# Patient Record
Sex: Male | Born: 1980 | Race: Black or African American | Hispanic: No | Marital: Single | Smoking: Never smoker
Health system: Southern US, Community
[De-identification: ages and names within clinical notes are randomized; demographics above are authoritative.]

## PROBLEM LIST (undated history)

## (undated) DIAGNOSIS — J45909 Unspecified asthma, uncomplicated: Secondary | ICD-10-CM

---

## 1999-06-04 ENCOUNTER — Emergency Department (HOSPITAL_COMMUNITY): Admission: EM | Admit: 1999-06-04 | Discharge: 1999-06-04 | Payer: Self-pay | Admitting: Emergency Medicine

## 2002-06-13 ENCOUNTER — Emergency Department (HOSPITAL_COMMUNITY): Admission: EM | Admit: 2002-06-13 | Discharge: 2002-06-13 | Payer: Self-pay | Admitting: Emergency Medicine

## 2003-10-01 ENCOUNTER — Encounter: Admission: RE | Admit: 2003-10-01 | Discharge: 2003-10-01 | Payer: Self-pay | Admitting: Internal Medicine

## 2004-04-15 ENCOUNTER — Encounter: Admission: RE | Admit: 2004-04-15 | Discharge: 2004-04-15 | Payer: Self-pay | Admitting: Internal Medicine

## 2004-08-12 ENCOUNTER — Encounter: Admission: RE | Admit: 2004-08-12 | Discharge: 2004-08-12 | Payer: Self-pay | Admitting: Internal Medicine

## 2004-08-12 ENCOUNTER — Ambulatory Visit (HOSPITAL_COMMUNITY): Admission: RE | Admit: 2004-08-12 | Discharge: 2004-08-12 | Payer: Self-pay | Admitting: Internal Medicine

## 2004-09-19 ENCOUNTER — Emergency Department (HOSPITAL_COMMUNITY): Admission: EM | Admit: 2004-09-19 | Discharge: 2004-09-20 | Payer: Self-pay | Admitting: Emergency Medicine

## 2006-02-24 IMAGING — US US SCROTUM
1 series · 14 of 25 positions shown · non-contrast
Comparison: none

CLINICAL DATA: Left scrotal pain. 
 SCROTAL ULTRASOUND AND ARTERIAL VENOUS DOPPLER STUDY ([DATE] HOURS)

[Series 1: unknown · 0.09mm/px · 14 of 48 slices shown]
[im 1/48]
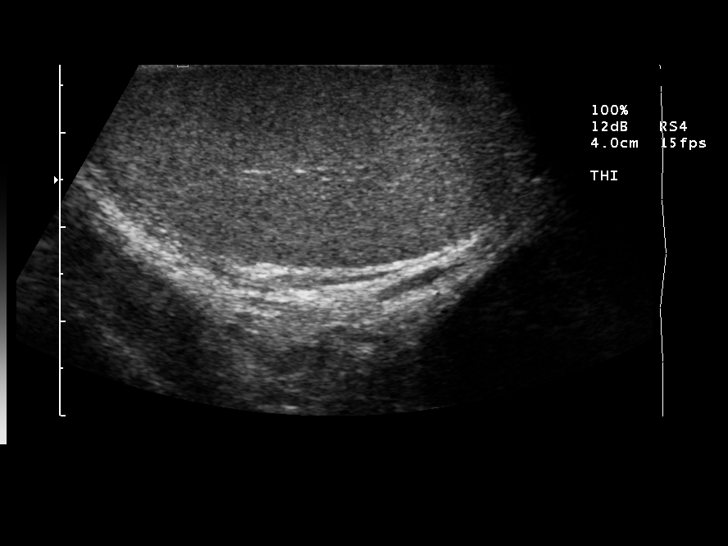
[im 4/48]
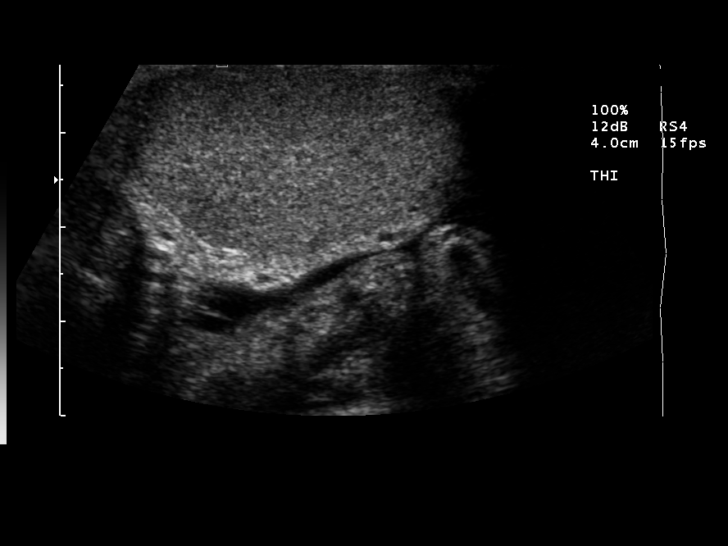
[im 8/48]
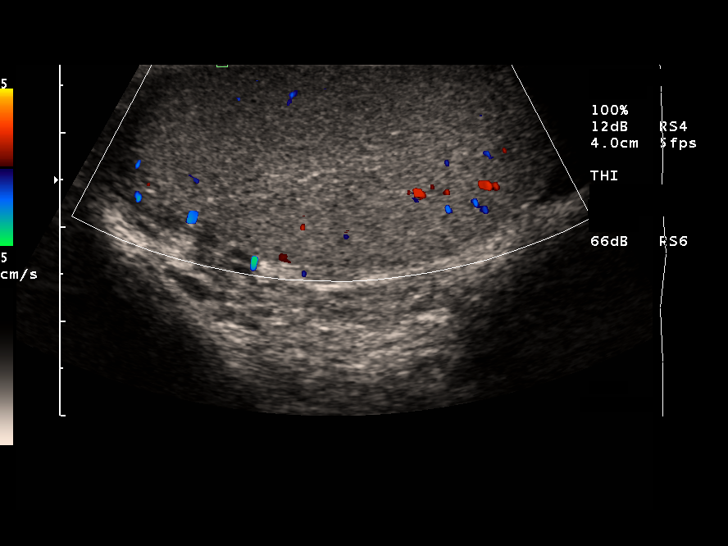
[im 12/48]
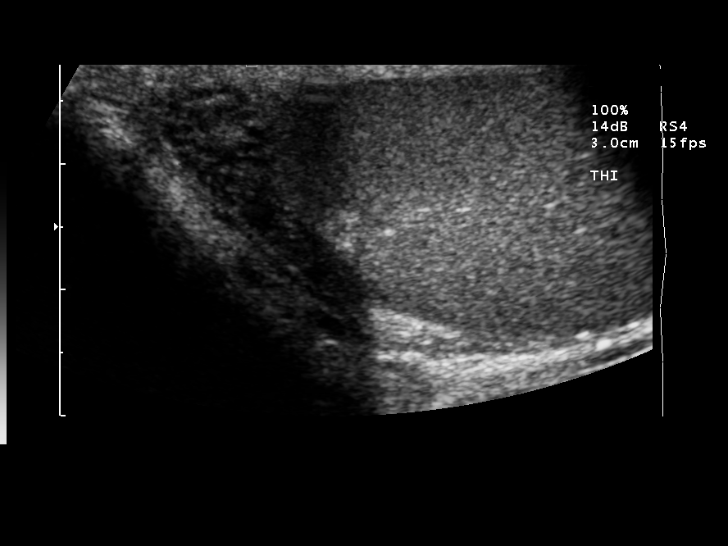
[im 16/48]
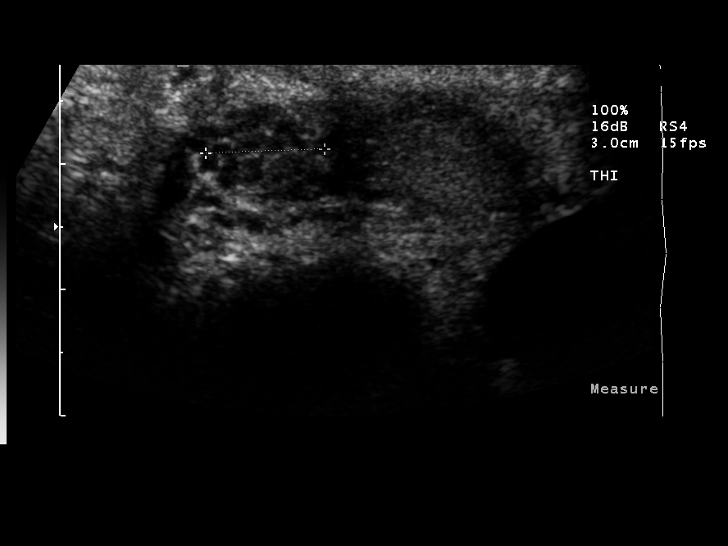
[im 18/48]
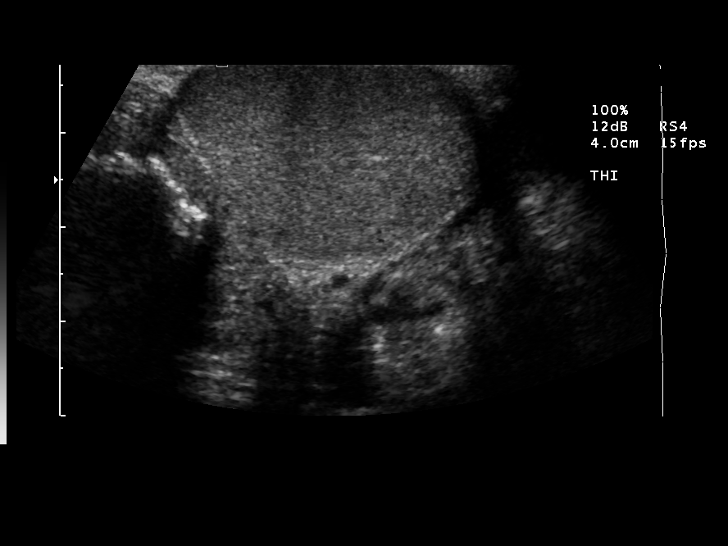
[im 22/48]
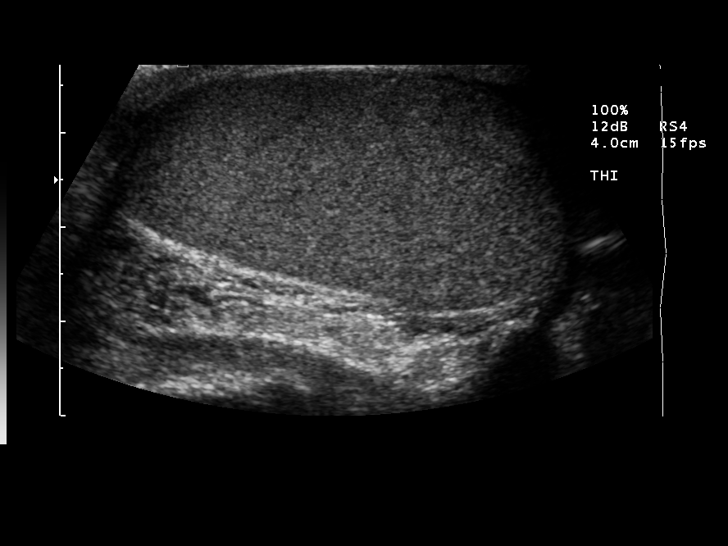
[im 26/48]
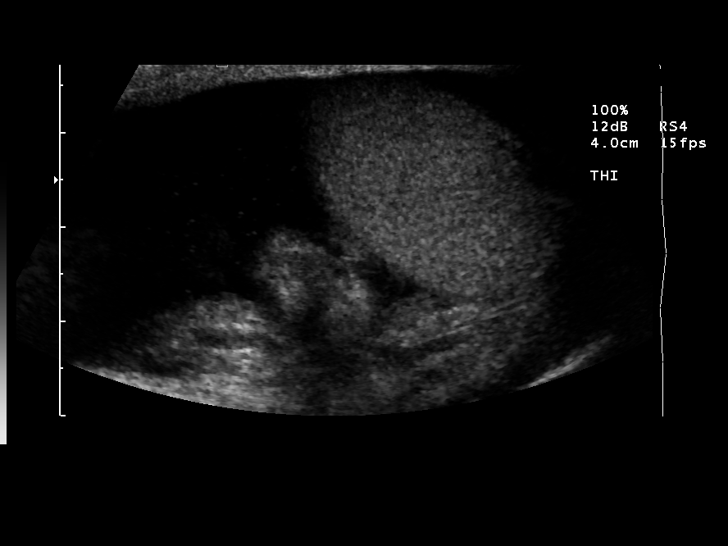
[im 30/48]
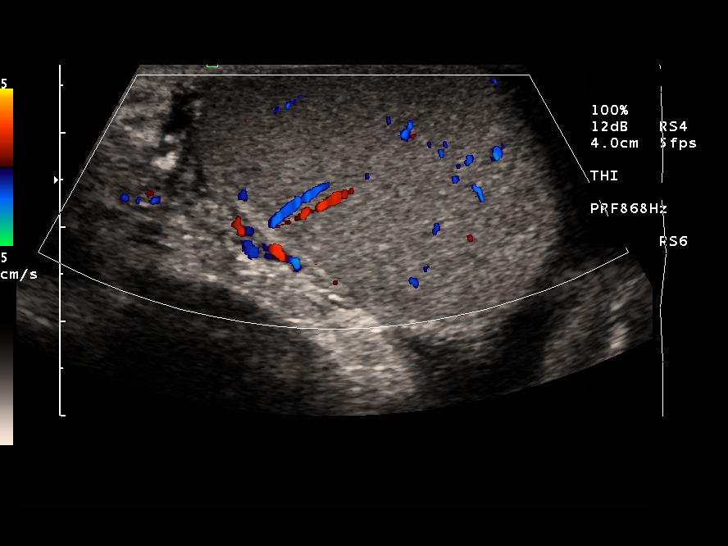
[im 32/48]
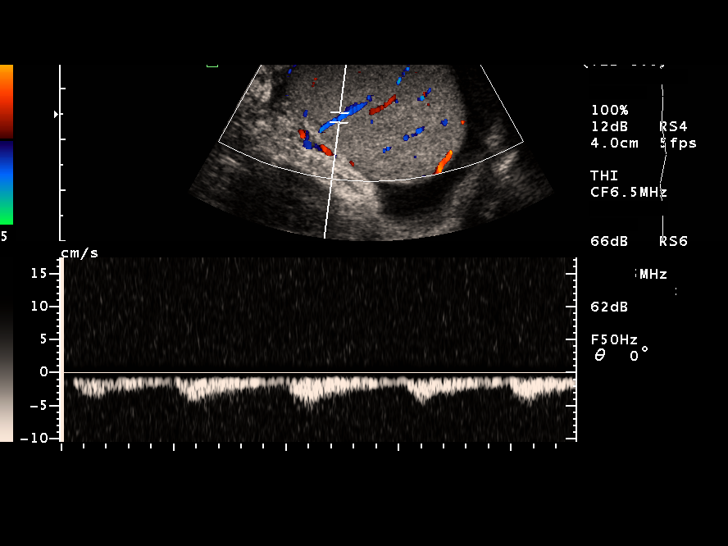
[im 36/48]
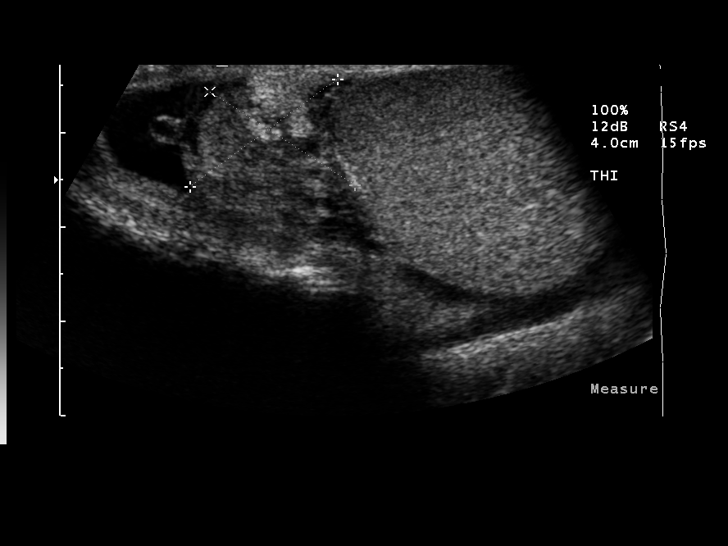
[im 40/48]
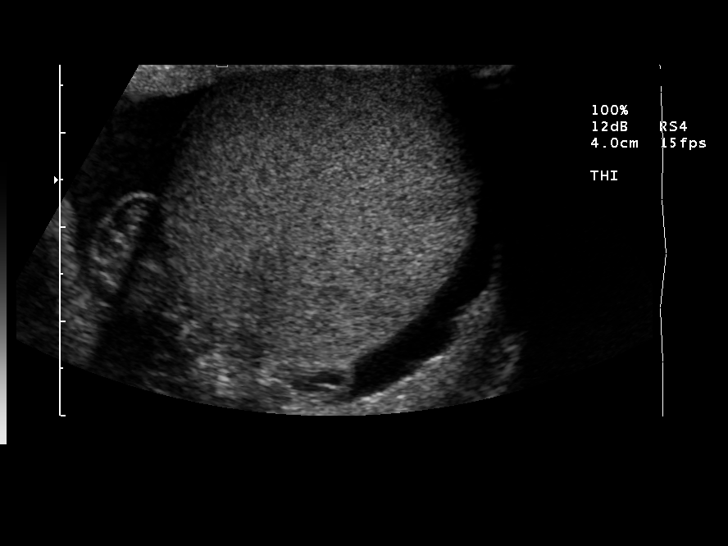
[im 44/48]
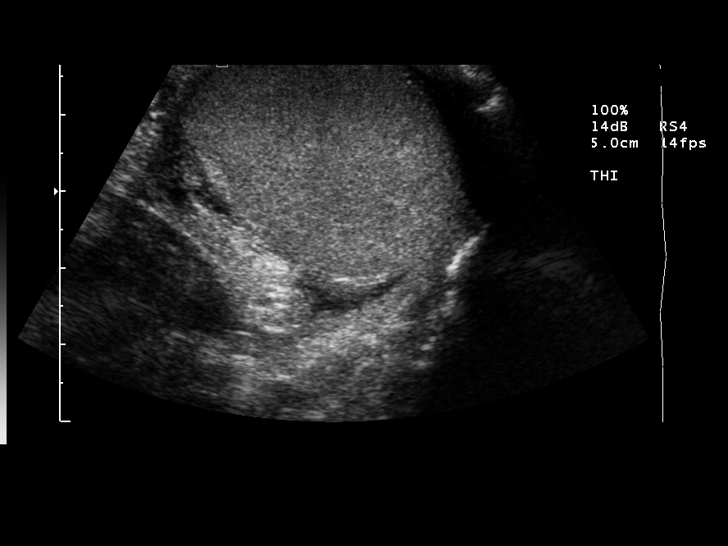
[im 48/48]
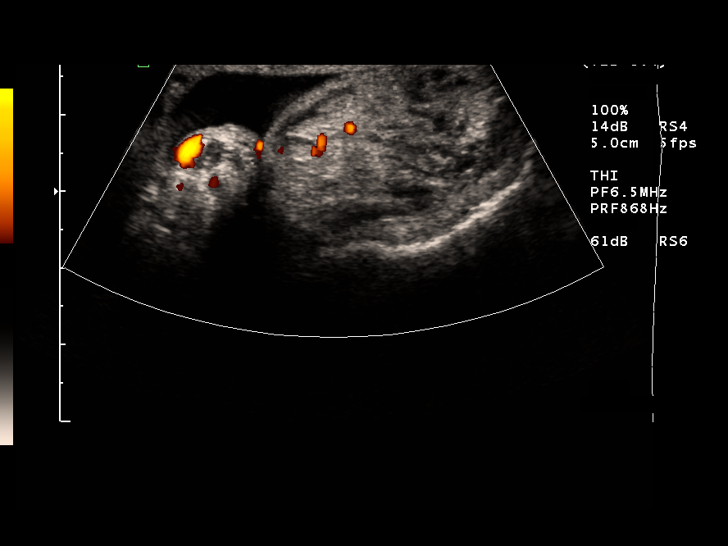

[14 of 25 positions shown; findings below may reference images not displayed]

FINDINGS: The testicles are within normal limits bilaterally.  Arterial flow is documented in both testicles excluding torsion. 
 The right epididymis is within normal limits. There is no right hydrocele. 
 The left epididymis is enlarged.  Flow is documented in the left epididymis. There is no hypervascularity of the left epididymis. A small left hydrocele is also noted. 
 IMPRESSION
 1.  No evidence of testicular torsion.
 2.  Findings most consistent with left epididymitis.  I would expect more hypovascularity however enlargement and hydrocele support left epididymitis.

## 2009-02-19 ENCOUNTER — Emergency Department (HOSPITAL_COMMUNITY): Admission: EM | Admit: 2009-02-19 | Discharge: 2009-02-19 | Payer: Self-pay | Admitting: Emergency Medicine

## 2011-10-15 ENCOUNTER — Emergency Department (HOSPITAL_COMMUNITY)
Admission: EM | Admit: 2011-10-15 | Discharge: 2011-10-16 | Disposition: A | Discharge status: Home / Self Care | Payer: Self-pay | Attending: Emergency Medicine | Admitting: Emergency Medicine

## 2011-10-15 DIAGNOSIS — L500 Allergic urticaria: Secondary | ICD-10-CM | POA: Insufficient documentation

## 2011-10-15 DIAGNOSIS — J45909 Unspecified asthma, uncomplicated: Secondary | ICD-10-CM | POA: Insufficient documentation

## 2011-10-15 DIAGNOSIS — B360 Pityriasis versicolor: Secondary | ICD-10-CM | POA: Insufficient documentation

## 2012-11-21 ENCOUNTER — Encounter (HOSPITAL_COMMUNITY): Payer: Self-pay | Admitting: Emergency Medicine

## 2012-11-21 ENCOUNTER — Encounter (HOSPITAL_COMMUNITY): Payer: Self-pay

## 2012-11-21 ENCOUNTER — Emergency Department (HOSPITAL_COMMUNITY)
Admission: EM | Admit: 2012-11-21 | Discharge: 2012-11-21 | Disposition: A | Discharge status: Home Health | Payer: Self-pay | Attending: Emergency Medicine | Admitting: Emergency Medicine

## 2012-11-21 ENCOUNTER — Emergency Department (HOSPITAL_COMMUNITY): Payer: Self-pay

## 2012-11-21 ENCOUNTER — Emergency Department (HOSPITAL_COMMUNITY)
Admission: EM | Admit: 2012-11-21 | Discharge: 2012-11-21 | Disposition: A | Discharge status: Other Acute Inpatient Hospital | Payer: Self-pay | Source: Home / Self Care | Attending: Emergency Medicine | Admitting: Emergency Medicine

## 2012-11-21 DIAGNOSIS — R209 Unspecified disturbances of skin sensation: Secondary | ICD-10-CM

## 2012-11-21 DIAGNOSIS — F17200 Nicotine dependence, unspecified, uncomplicated: Secondary | ICD-10-CM | POA: Insufficient documentation

## 2012-11-21 DIAGNOSIS — H539 Unspecified visual disturbance: Secondary | ICD-10-CM | POA: Insufficient documentation

## 2012-11-21 DIAGNOSIS — R200 Anesthesia of skin: Secondary | ICD-10-CM

## 2012-11-21 DIAGNOSIS — R202 Paresthesia of skin: Secondary | ICD-10-CM

## 2012-11-21 DIAGNOSIS — R42 Dizziness and giddiness: Secondary | ICD-10-CM

## 2012-11-21 DIAGNOSIS — R51 Headache: Secondary | ICD-10-CM | POA: Insufficient documentation

## 2012-11-21 NOTE — ED Notes (Signed)
Pt c/o dizziness and left arm numbness x weeks; pt sent here from [facility] for further eval; pt sts some HA also

## 2012-11-21 NOTE — ED Provider Notes (Signed)
History     CSN: [id number]  Arrival date & time 11/21/12  4098   First MD Initiated Contact with Patient 11/21/12 1045      Chief Complaint  Patient presents with  . Dizziness  . Numbness    (Consider location/radiation/quality/duration/timing/severity/associated sxs/prior treatment) The history is provided by the patient.   patient presents with on and off numbness in his left arm. It comes and goes. It is not associated with exertion. He states it feels as if he had been sleeping on the arm. He states he had not been laying on it happens. No chest pain. He states there is no weakness with it. He states last night he had a sudden throbbing headache. He states he felt like passing out. It is since resolved. No difficulty with vision this time. He states his vision [place] the headache came on. Patient states people he work with is worried that he has high blood pressure had a stroke. He has no history of strokes. No history of heart disease. He was seen in urgent care and sent here.  History reviewed. No pertinent past medical history.  History reviewed. No pertinent past surgical history.  History reviewed. No pertinent family history.  History  Substance Use Topics  . Smoking status: Current Every Day Smoker  . Smokeless tobacco: Not on file  . Alcohol Use: Yes      Review of Systems  Constitutional: Negative for activity change and appetite change.  HENT: Negative for neck stiffness.   Eyes: Positive for visual disturbance. Negative for pain.  Respiratory: Negative for chest tightness and shortness of breath.   Cardiovascular: Negative for chest pain and leg swelling.  Gastrointestinal: Negative for nausea, vomiting, abdominal pain and diarrhea.  Genitourinary: Negative for flank pain.  Musculoskeletal: Negative for back pain.  Skin: Negative for rash.  Neurological: Positive for headaches. Negative for weakness and numbness.  Psychiatric/Behavioral: Negative for  behavioral problems.    Allergies  Review of patient's allergies indicates no known allergies.  Home Medications  No current outpatient prescriptions on file.  BP 121/68  Pulse 69  Temp 98 F (36.7 C) (Oral)  Resp 16  SpO2 100%  Physical Exam  Nursing note and vitals reviewed. Constitutional: He is oriented to person, place, and time. He appears well-developed and well-nourished.  HENT:  Head: Normocephalic and atraumatic.  Eyes: EOM are normal. Pupils are equal, round, and reactive to light.  Neck: Normal range of motion. Neck supple.  Cardiovascular: Normal rate, regular rhythm and normal heart sounds.   No murmur heard. Pulmonary/Chest: Effort normal and breath sounds normal.  Abdominal: Soft. Bowel sounds are normal. He exhibits no distension and no mass. There is no tenderness. There is no rebound and no guarding.  Musculoskeletal: Normal range of motion. He exhibits no edema.  Neurological: He is alert and oriented to person, place, and time. No cranial nerve deficit.       Normal gait. Dressings bilaterally. Extraocular movements intact.  Skin: Skin is warm and dry.  Psychiatric: He has a normal mood and affect.    ED Course  Procedures (including critical care time)  Labs Reviewed - No data to display Ct Head Wo Contrast  11/21/2012  *RADIOLOGY REPORT*  Clinical Data: Dizziness with left arm numbness.  Near-syncope 3 days ago.  Headache  CT HEAD WITHOUT CONTRAST  Technique:  Contiguous axial images were obtained from the base of the skull through the vertex without contrast.  Comparison: None.  Findings: Ventricle  size is normal.  Negative for infarct or mass. Negative for intracranial hemorrhage.  There is dense ossification of the anterior falx which is not clinically significant.  No lesions of the calvarium.  Visualized paranasal sinuses are clear.  IMPRESSION: Negative   Original Report Authenticated By: Janeece Riggers, M.D.      1. Paresthesias   2. Headache        MDM  Patient seizures and is left upper extremity come and go. Lab work does not appear to be needed this time. Head CT was done due to reports of a headache that came on suddenly and was throbbing. It is reassuring. Patient be discharged home and followup with her primary care Dr.        Juliet Rude. Rubin Payor, MD 11/21/12 1423

## 2012-11-21 NOTE — ED Provider Notes (Addendum)
History     CSN: [id number]  Arrival date & time 11/21/12  4098   First MD Initiated Contact with Patient 11/21/12 [phone]      Chief Complaint  Patient presents with  . Dizziness    (Consider location/radiation/quality/duration/timing/severity/associated sxs/prior treatment) HPI Comments: Gabriel Cervantes, presents to urgent care this morning describing that as he was working yesterday at one point he felt his vision went "off" , and experienced a sudden headache and dizziness making him fall to the ground on his knees " felt like passing out with an associated left upper extremity numbness and spastic-type pains.". He adds that for the last 6 months or so he's been experiencing intermittent numbness and occasional weakness of his left upper extremity. Denies any shortness of breath, chest pain, abdominal pain. Does feel his left arm to be somewhat "bothering him like having spasms"   The history is provided by the patient.    History reviewed. No pertinent past medical history.  History reviewed. No pertinent past surgical history.  History reviewed. No pertinent family history.  History  Substance Use Topics  . Smoking status: Current Every Day Smoker  . Smokeless tobacco: Not on file  . Alcohol Use: Yes      Review of Systems  Constitutional: Positive for diaphoresis and activity change. Negative for fever and fatigue.  HENT: Negative for neck pain.   Respiratory: Negative for shortness of breath.   Cardiovascular: Negative for chest pain and leg swelling.  Skin: Negative for rash.  Neurological: Positive for dizziness, weakness, numbness and headaches. Negative for tremors and facial asymmetry.    Allergies  Review of patient's allergies indicates no known allergies.  Home Medications  No current outpatient prescriptions on file.  BP 134/87  Pulse 72  Temp 98.4 F (36.9 C) (Oral)  Resp 18  SpO2 100%  Physical Exam  Nursing note and vitals  reviewed. Constitutional: He is oriented to person, place, and time. Vital signs are normal. He appears well-developed and well-nourished.  Non-toxic appearance. He does not have a sickly appearance. He does not appear ill. No distress.  HENT:  Head: Normocephalic.  Eyes: Conjunctivae normal are normal.  Neck: Neck supple. No JVD present.  Cardiovascular: Normal rate, regular rhythm, normal heart sounds and normal pulses.   No extrasystoles are present. Exam reveals no gallop and no friction rub.   No murmur heard.      NSR - 68 BPM- 60 SECOND HANDHELD DEVICE MONITOR - NO OBSERVABLE ST-T CHANGE OR AV block.  Pulmonary/Chest: Effort normal and breath sounds normal. No respiratory distress. He has no decreased breath sounds. He has no wheezes. He has no rales. He exhibits no tenderness.  Abdominal: Soft. There is no tenderness. There is no rebound and no guarding.  Musculoskeletal:       Left elbow: He exhibits normal range of motion, no swelling, no effusion, no deformity and no laceration. no tenderness found.       Arms: Neurological: He is alert and oriented to person, place, and time. He displays no atrophy and no tremor. No cranial nerve deficit or sensory deficit. He exhibits normal muscle tone. He displays a negative Romberg sign. He displays no seizure activity. GCS eye subscore is 4. GCS verbal subscore is 5. GCS motor subscore is 6.  Skin: No rash noted. No erythema.    ED Course  Procedures (including critical care time)   Labs Reviewed  GLUCOSE, CAPILLARY   No results found.   1. Dizziness  2. Numbness and tingling in left arm       MDM  Patient presents urgent care 12 hours after a questionable syncopal episode, describing neurological symptoms intermittently for the last 6 months. At urgent care neurological exam at 9:30 AM revealed no neurological focal deficits. Given patient's history and symptomatology we'll transfer to the emergency department for further  observation and evaluation.        Jimmie Molly, MD 11/21/12 0930  Jimmie Molly, MD 11/21/12 [phone]

## 2012-11-21 NOTE — ED Notes (Signed)
States he has had problems for past 6 months of arms going numb, and reportedly has passed out yesterday at work. Became concerned , since when his eyes went in different directions yesterday and he passed out at work; his co- workers at [employer] told him he may have high blood pressure or stroke symptoms  NAD at present, MAEW, alert & conversant, oriented, w/d/color good

## 2013-07-17 ENCOUNTER — Encounter (HOSPITAL_COMMUNITY): Payer: Self-pay | Admitting: Emergency Medicine

## 2013-07-17 ENCOUNTER — Emergency Department (HOSPITAL_COMMUNITY)
Admission: EM | Admit: 2013-07-17 | Discharge: 2013-07-17 | Discharge status: Against Medical Advice | Payer: Self-pay | Attending: Emergency Medicine | Admitting: Emergency Medicine

## 2013-07-17 DIAGNOSIS — S8990XA Unspecified injury of unspecified lower leg, initial encounter: Secondary | ICD-10-CM | POA: Insufficient documentation

## 2013-07-17 DIAGNOSIS — Y92410 Unspecified street and highway as the place of occurrence of the external cause: Secondary | ICD-10-CM | POA: Insufficient documentation

## 2013-07-17 DIAGNOSIS — IMO0002 Reserved for concepts with insufficient information to code with codable children: Secondary | ICD-10-CM | POA: Insufficient documentation

## 2013-07-17 DIAGNOSIS — F17200 Nicotine dependence, unspecified, uncomplicated: Secondary | ICD-10-CM | POA: Insufficient documentation

## 2013-07-17 DIAGNOSIS — S99929A Unspecified injury of unspecified foot, initial encounter: Secondary | ICD-10-CM | POA: Insufficient documentation

## 2013-07-17 DIAGNOSIS — Y9389 Activity, other specified: Secondary | ICD-10-CM | POA: Insufficient documentation
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
## 2013-07-18 ENCOUNTER — Emergency Department (HOSPITAL_COMMUNITY)
Admission: EM | Admit: 2013-07-18 | Discharge: 2013-07-18 | Disposition: A | Discharge status: Home / Self Care | Payer: No Typology Code available for payment source | Attending: Emergency Medicine | Admitting: Emergency Medicine

## 2013-07-18 ENCOUNTER — Encounter (HOSPITAL_COMMUNITY): Payer: Self-pay | Admitting: *Deleted

## 2013-07-18 DIAGNOSIS — S0990XA Unspecified injury of head, initial encounter: Secondary | ICD-10-CM | POA: Insufficient documentation

## 2013-07-18 DIAGNOSIS — S46909A Unspecified injury of unspecified muscle, fascia and tendon at shoulder and upper arm level, unspecified arm, initial encounter: Secondary | ICD-10-CM | POA: Insufficient documentation

## 2013-07-18 DIAGNOSIS — S39012A Strain of muscle, fascia and tendon of lower back, initial encounter: Secondary | ICD-10-CM

## 2013-07-18 DIAGNOSIS — IMO0002 Reserved for concepts with insufficient information to code with codable children: Secondary | ICD-10-CM | POA: Insufficient documentation

## 2013-07-18 DIAGNOSIS — Y9389 Activity, other specified: Secondary | ICD-10-CM | POA: Insufficient documentation

## 2013-07-18 DIAGNOSIS — F17200 Nicotine dependence, unspecified, uncomplicated: Secondary | ICD-10-CM | POA: Insufficient documentation

## 2013-07-18 DIAGNOSIS — Y92410 Unspecified street and highway as the place of occurrence of the external cause: Secondary | ICD-10-CM | POA: Insufficient documentation

## 2013-07-18 DIAGNOSIS — S4980XA Other specified injuries of shoulder and upper arm, unspecified arm, initial encounter: Secondary | ICD-10-CM | POA: Insufficient documentation
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
## 2014-06-05 IMAGING — CT CT HEAD W/O CM
1 series · 16 of 30 positions shown, 20 images · non-contrast
Comparison: None.

CLINICAL DATA: Dizziness with left arm numbness.  Near-syncope 3
days ago.  Headache

CT HEAD WITHOUT CONTRAST
TECHNIQUE: Contiguous axial images were obtained from the base of
the skull through the vertex without contrast.

[Series 4: head trauma 4.8 h37s · axial · 0.44mm/px · z∈[-104,+28]mm · 16 of 30 slices shown, 20 images]
[im 2/30  brain]
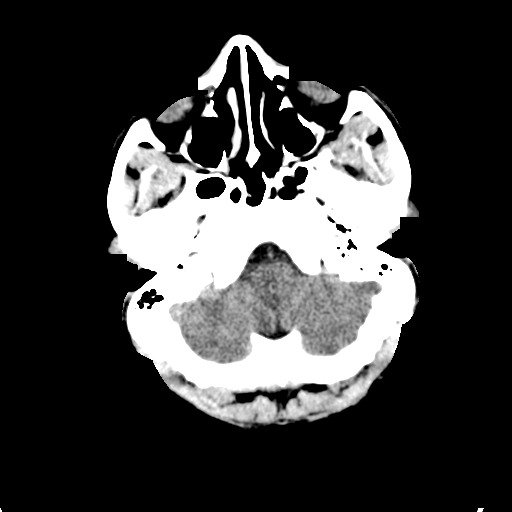
[im 2/30  bone]
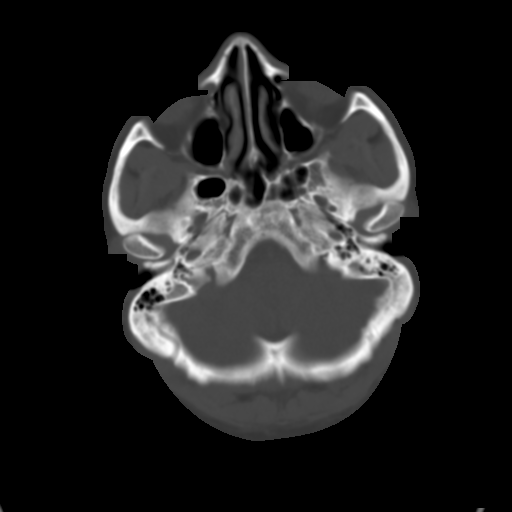
[im 4/30  brain]
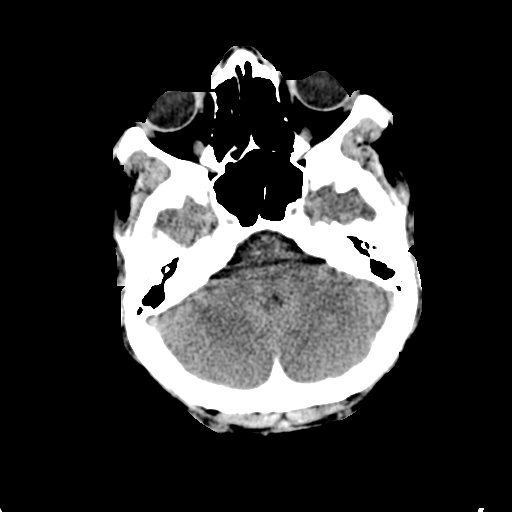
[im 6/30  brain]
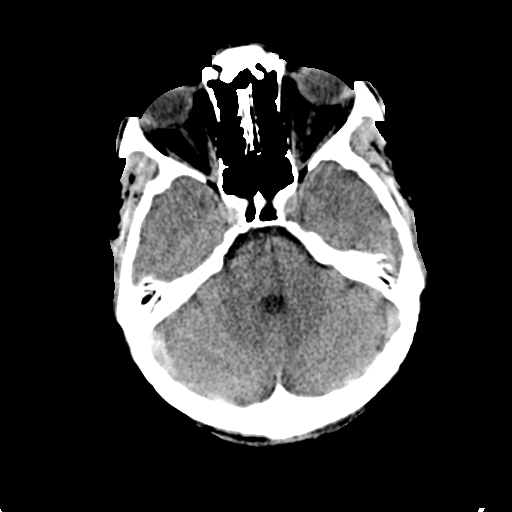
[im 8/30  brain]
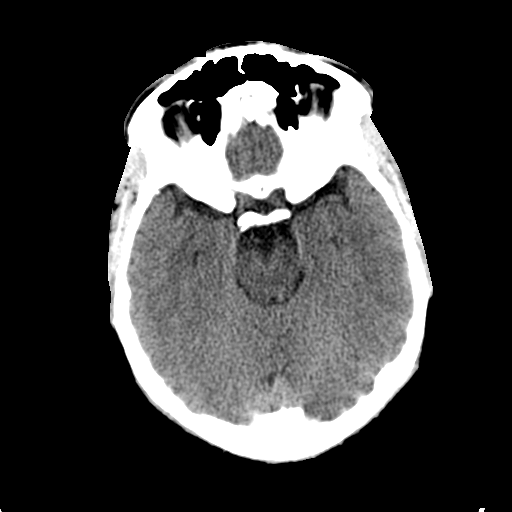
[im 9/30  brain]
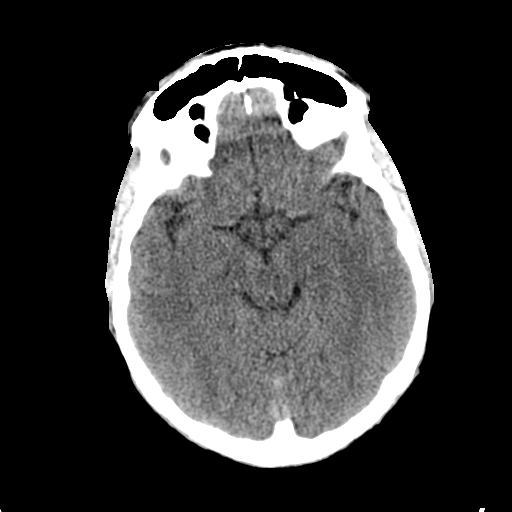
[im 9/30  bone]
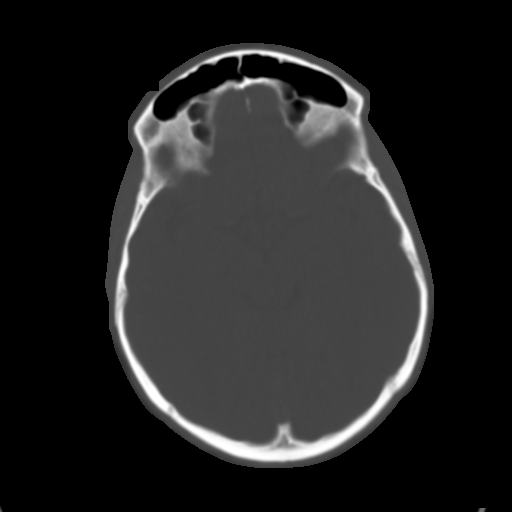
[im 11/30  brain]
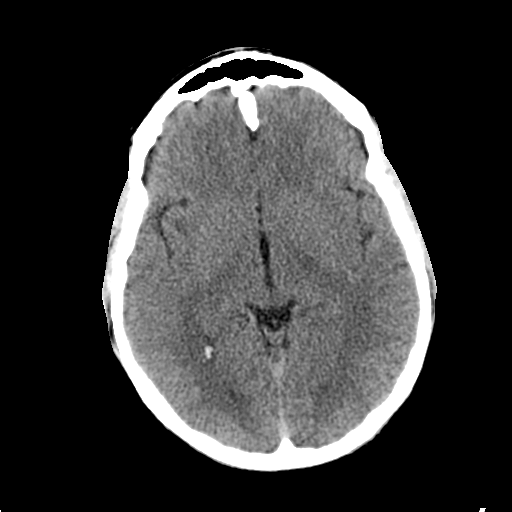
[im 13/30  brain]
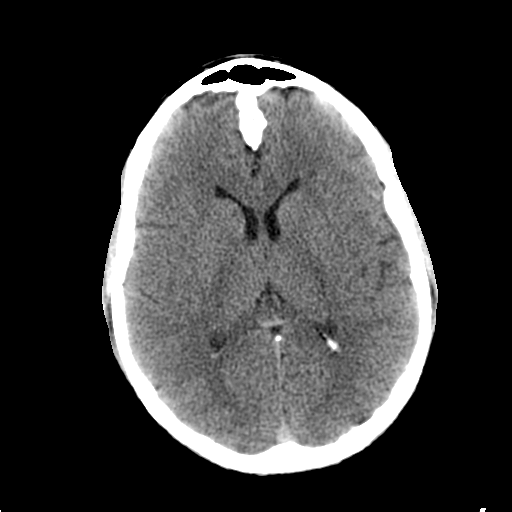
[im 15/30  brain]
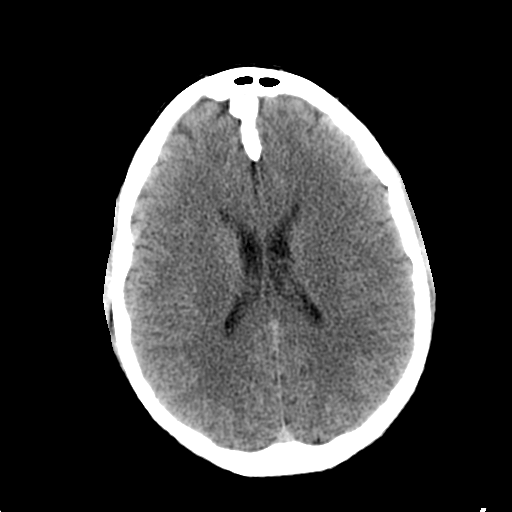
[im 16/30  brain]
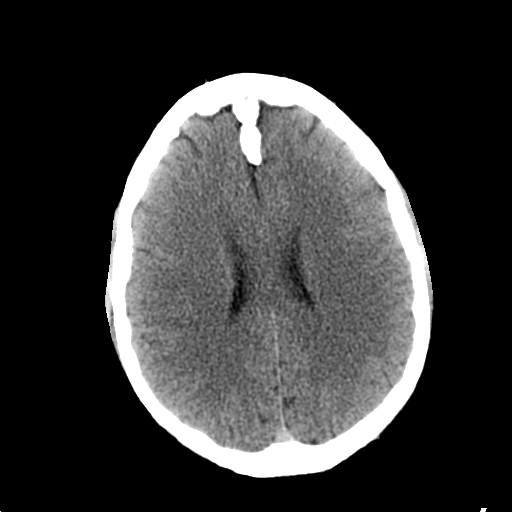
[im 16/30  bone]
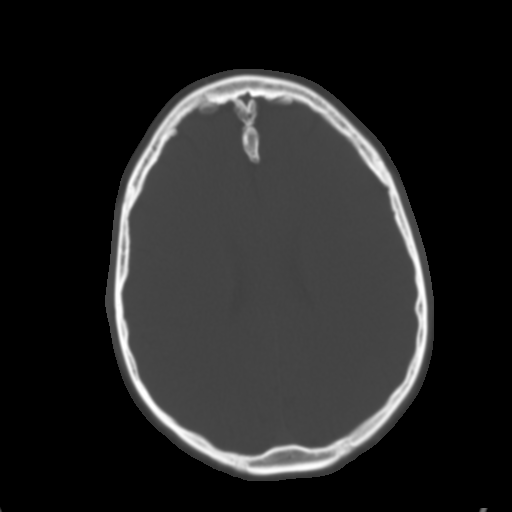
[im 18/30  brain]
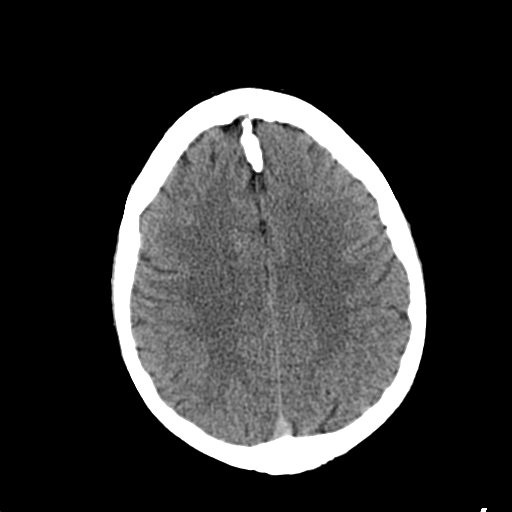
[im 20/30  brain]
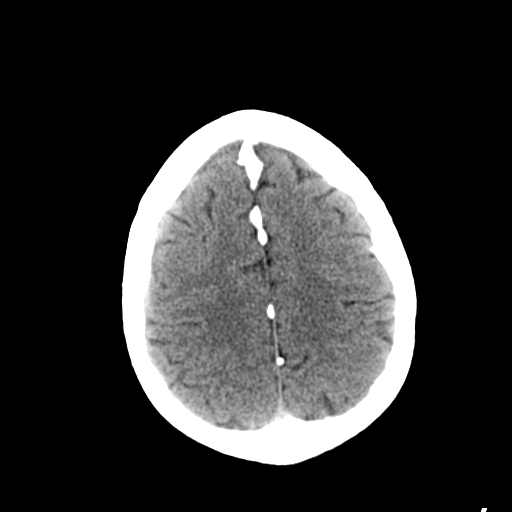
[im 22/30  brain]
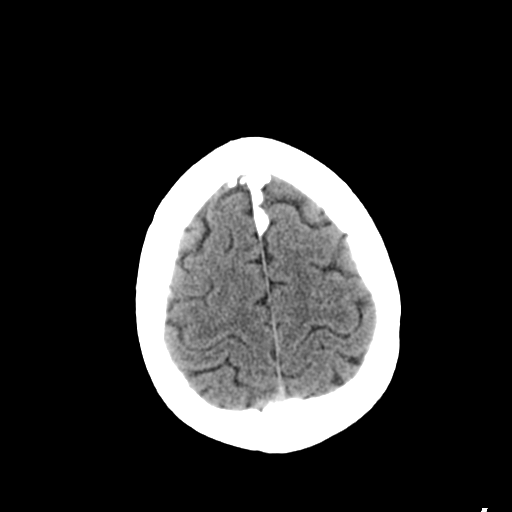
[im 23/30  brain]
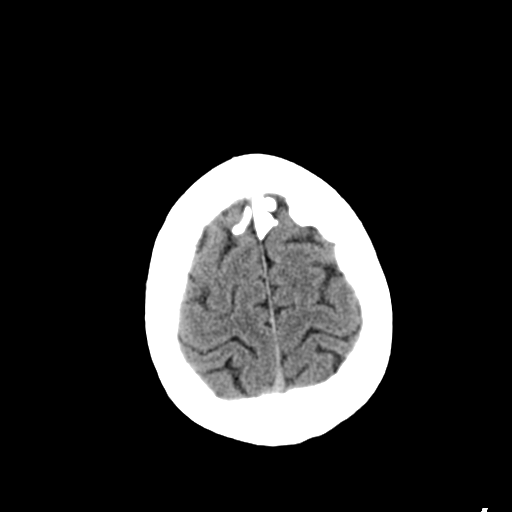
[im 23/30  bone]
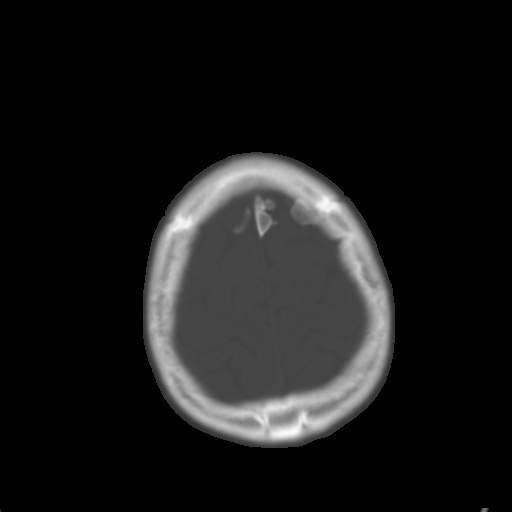
[im 25/30  brain]
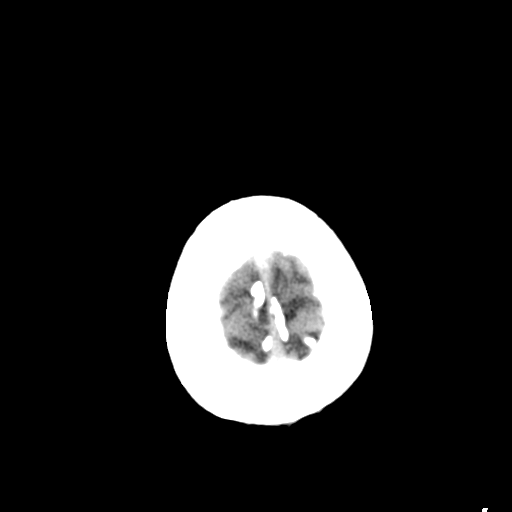
[im 27/30  brain]
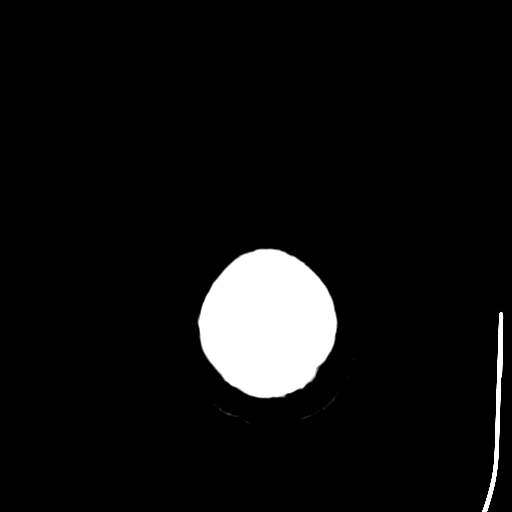
[im 29/30  brain]
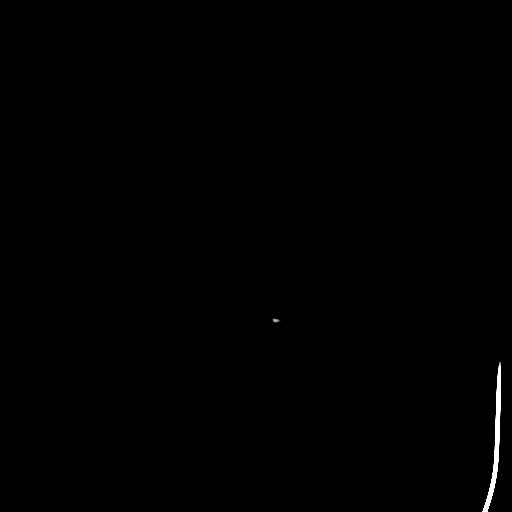

[16 of 30 positions shown; findings below may reference images not displayed]

FINDINGS: Ventricle size is normal.  Negative for infarct or mass.
Negative for intracranial hemorrhage.  There is dense ossification
of the anterior falx which is not clinically significant.  No
lesions of the calvarium.  Visualized paranasal sinuses are clear.
IMPRESSION: Negative

## 2017-10-25 ENCOUNTER — Other Ambulatory Visit: Payer: Self-pay

## 2017-10-25 ENCOUNTER — Encounter (HOSPITAL_COMMUNITY): Payer: Self-pay | Admitting: Emergency Medicine

## 2017-10-25 DIAGNOSIS — M791 Myalgia, unspecified site: Secondary | ICD-10-CM | POA: Insufficient documentation

## 2017-10-25 DIAGNOSIS — Z5321 Procedure and treatment not carried out due to patient leaving prior to being seen by health care provider: Secondary | ICD-10-CM | POA: Insufficient documentation

## 2017-10-25 LAB — URINALYSIS, ROUTINE W REFLEX MICROSCOPIC
Bilirubin Urine: NEGATIVE
Glucose, UA: NEGATIVE mg/dL
Hgb urine dipstick: NEGATIVE
Ketones, ur: NEGATIVE mg/dL
Leukocytes, UA: NEGATIVE
Nitrite: NEGATIVE
Protein, ur: NEGATIVE mg/dL
Specific Gravity, Urine: 1.024 (ref 1.005–1.030)
pH: 5 (ref 5.0–8.0)

## 2017-10-25 LAB — BASIC METABOLIC PANEL
Anion gap: 8 (ref 5–15)
BUN: 18 mg/dL (ref 6–20)
CO2: 25 mmol/L (ref 22–32)
Calcium: 9.4 mg/dL (ref 8.9–10.3)
Chloride: 105 mmol/L (ref 101–111)
Creatinine, Ser: 1.17 mg/dL (ref 0.61–1.24)
GFR calc Af Amer: >60 mL/min (ref >60)
GFR calc non Af Amer: >60 mL/min (ref >60)
Glucose, Bld: 93 mg/dL (ref 65–99)
Potassium: 4 mmol/L (ref 3.5–5.1)
Sodium: 138 mmol/L (ref 135–145)

## 2017-10-25 LAB — CBC
HCT: 42.4 % (ref 39.0–52.0)
HEMOGLOBIN: 14.1 g/dL (ref 13.0–17.0)
MCH: 27.3 pg (ref 26.0–34.0)
MCHC: 33.3 g/dL (ref 30.0–36.0)
MCV: 82.2 fL (ref 78.0–100.0)
Platelets: 216 10*3/uL (ref 150–400)
RBC: 5.16 MIL/uL (ref 4.22–5.81)
RDW: 13.6 % (ref 11.5–15.5)
WBC: 10.8 10*3/uL — ABNORMAL HIGH (ref 4.0–10.5)

## 2017-10-25 NOTE — ED Triage Notes (Signed)
Pt c/o back pain and "severe spasms" for "a while". Pt states he has been told to take vitamins and hasn't.

## 2017-10-26 ENCOUNTER — Emergency Department (HOSPITAL_COMMUNITY)
Admission: EM | Admit: 2017-10-26 | Discharge: 2017-10-26 | Disposition: A | Discharge status: Home / Self Care | Payer: BLUE CROSS/BLUE SHIELD | Attending: Emergency Medicine | Admitting: Emergency Medicine

## 2017-10-26 HISTORY — DX: Unspecified asthma, uncomplicated: J45.909

## 2017-10-26 LAB — CK: CK TOTAL: 637 U/L — AB (ref 49–397)

## 2017-10-26 NOTE — ED Notes (Signed)
No answer in WR x1

## 2017-10-26 NOTE — ED Notes (Signed)
No answer in WR x 2

## 2018-02-15 ENCOUNTER — Emergency Department (HOSPITAL_COMMUNITY)
Admission: EM | Admit: 2018-02-15 | Discharge: 2018-02-15 | Disposition: A | Discharge status: Home / Self Care | Payer: No Typology Code available for payment source | Attending: Emergency Medicine | Admitting: Emergency Medicine

## 2018-02-15 ENCOUNTER — Other Ambulatory Visit: Payer: Self-pay

## 2018-02-15 ENCOUNTER — Encounter (HOSPITAL_COMMUNITY): Payer: Self-pay | Admitting: Emergency Medicine

## 2018-02-15 DIAGNOSIS — Y92410 Unspecified street and highway as the place of occurrence of the external cause: Secondary | ICD-10-CM | POA: Insufficient documentation

## 2018-02-15 DIAGNOSIS — M25522 Pain in left elbow: Secondary | ICD-10-CM

## 2018-02-15 DIAGNOSIS — Y9389 Activity, other specified: Secondary | ICD-10-CM | POA: Insufficient documentation

## 2018-02-15 DIAGNOSIS — M542 Cervicalgia: Secondary | ICD-10-CM | POA: Diagnosis not present

## 2018-02-15 DIAGNOSIS — J45909 Unspecified asthma, uncomplicated: Secondary | ICD-10-CM | POA: Diagnosis not present

## 2018-02-15 DIAGNOSIS — S4992XA Unspecified injury of left shoulder and upper arm, initial encounter: Secondary | ICD-10-CM | POA: Diagnosis present

## 2018-02-15 DIAGNOSIS — Y999 Unspecified external cause status: Secondary | ICD-10-CM | POA: Diagnosis not present

## 2018-02-15 MED ORDER — CYCLOBENZAPRINE HCL 10 MG PO TABS: 10.0000 mg | ORAL_TABLET | Freq: Two times a day (BID) | ORAL | 0 refills | Status: AC | PRN

## 2018-02-15 NOTE — Discharge Instructions (Signed)
Please read instructions below. Apply ice to your areas of pain for 20 minutes at a time. You can take 600 mg of Advil/ibuprofen every 6 hours as needed for pain. You can take flexeril every 12 hours as needed for muscle spasm. Be aware this medication can make you drowsy. Schedule an appointment with your primary care provider to follow up on your visit today if symptoms persist. Return to the ER for severely worsening headache, vision changes, if new numbness or tingling in your arms or legs, inability to urinate, inability to hold your bowels, or weakness in your extremities.
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
## 2020-04-17 ENCOUNTER — Ambulatory Visit: Payer: Self-pay | Attending: Internal Medicine

## 2020-04-17 DIAGNOSIS — Z23 Encounter for immunization: Secondary | ICD-10-CM

## 2020-04-17 NOTE — Progress Notes (Signed)
   Covid-19 Vaccination Clinic  Name:  Gabriel Cervantes    MRN: [id number] DOB: 07-Nov-1981  04/17/2020  Mr. [name] was observed post Covid-19 immunization for 15 minutes without incident. He was provided with Vaccine Information Sheet and instruction to access the V-Safe system.   Mr. [name] was instructed to call 911 with any severe reactions post vaccine: Marland Kitchen Difficulty breathing  . Swelling of face and throat  . A fast heartbeat  . A bad rash all over body  . Dizziness and weakness   Immunizations Administered    Name Date Dose VIS Date Route   Pfizer COVID-19 Vaccine 04/17/2020  3:23 PM 0.3 mL 02/07/2019 Intramuscular   Manufacturer: [employer], [employer]   Lot: [id number]   NDC: [address]-3

## 2020-05-14 ENCOUNTER — Ambulatory Visit: Payer: Self-pay

## 2020-10-08 ENCOUNTER — Other Ambulatory Visit: Payer: Self-pay

## 2020-10-08 ENCOUNTER — Emergency Department (HOSPITAL_COMMUNITY)
Admission: EM | Admit: 2020-10-08 | Discharge: 2020-10-08 | Disposition: A | Discharge status: Home / Self Care | Payer: Self-pay | Attending: Emergency Medicine | Admitting: Emergency Medicine

## 2020-10-08 ENCOUNTER — Encounter (HOSPITAL_COMMUNITY): Payer: Self-pay

## 2020-10-08 DIAGNOSIS — J45909 Unspecified asthma, uncomplicated: Secondary | ICD-10-CM | POA: Insufficient documentation

## 2020-10-08 DIAGNOSIS — K625 Hemorrhage of anus and rectum: Secondary | ICD-10-CM | POA: Insufficient documentation

## 2020-10-08 LAB — BASIC METABOLIC PANEL
Anion gap: 9 (ref 5–15)
BUN: 13 mg/dL (ref 6–20)
CO2: 25 mmol/L (ref 22–32)
Calcium: 9.4 mg/dL (ref 8.9–10.3)
Chloride: 102 mmol/L (ref 98–111)
Creatinine, Ser: 1.07 mg/dL (ref 0.61–1.24)
GFR, Estimated: >60 mL/min (ref >60)
Glucose, Bld: 101 mg/dL — ABNORMAL HIGH (ref 70–99)
Potassium: 4.2 mmol/L (ref 3.5–5.1)
Sodium: 136 mmol/L (ref 135–145)

## 2020-10-08 LAB — CBC WITH DIFFERENTIAL/PLATELET
Abs Immature Granulocytes: 0.07 10*3/uL (ref 0.00–0.07)
Basophils Absolute: 0.1 10*3/uL (ref 0.0–0.1)
Basophils Relative: 1 %
Eosinophils Absolute: 0.1 10*3/uL (ref 0.0–0.5)
Eosinophils Relative: 1 %
HCT: 44.6 % (ref 39.0–52.0)
Hemoglobin: 14.8 g/dL (ref 13.0–17.0)
Immature Granulocytes: 1 %
Lymphocytes Relative: 26 %
Lymphs Abs: 2.6 10*3/uL (ref 0.7–4.0)
MCH: 27.6 pg (ref 26.0–34.0)
MCHC: 33.2 g/dL (ref 30.0–36.0)
MCV: 83.2 fL (ref 80.0–100.0)
Monocytes Absolute: 0.7 10*3/uL (ref 0.1–1.0)
Monocytes Relative: 7 %
Neutro Abs: 6.6 10*3/uL (ref 1.7–7.7)
Neutrophils Relative %: 64 %
Platelets: 247 10*3/uL (ref 150–400)
RBC: 5.36 MIL/uL (ref 4.22–5.81)
RDW: 13.4 % (ref 11.5–15.5)
WBC: 10 10*3/uL (ref 4.0–10.5)
nRBC: 0 % (ref 0.0–0.2)

## 2020-10-08 LAB — POC OCCULT BLOOD, ED: Fecal Occult Bld: POSITIVE — AB

## 2020-10-08 MED ORDER — HYDROCORTISONE (PERIANAL) 2.5 % EX CREA: 1.0000 "application " | TOPICAL_CREAM | Freq: Two times a day (BID) | CUTANEOUS | 0 refills | Status: AC

## 2020-10-08 NOTE — ED Triage Notes (Signed)
Patient arrived stating tonight he had some bright red blood when he wiped after a bowel movement but has had rectal pain for about 3 days. Patient concerned due to reading about possible colon cancer.

## 2020-10-08 NOTE — ED Provider Notes (Signed)
[facility]-EMERGENCY DEPT Provider Note   CSN: [id number] Arrival date & time: 10/08/20  2123     History Chief Complaint  Patient presents with  . Rectal Pain    Gabriel Cervantes is a 39 y.o. male.  Pt presents to the ED today with blood in stool.  Pt said he has had rectal pain for about 3 days.  He noticed a few specks of blood today.  He is worried that he has colon cancer.  He has never had this problem in the past.  He denies constipation or hard stools.  He denies any rectal trauma.        Past Medical History:  Diagnosis Date  . Asthma     There are no problems to display for this patient.   History reviewed. No pertinent surgical history.     No family history on file.  Social History   Tobacco Use  . Smoking status: Never Smoker  . Smokeless tobacco: Never Used  Substance Use Topics  . Alcohol use: Yes  . Drug use: No    Types: Marijuana    Home Medications Prior to Admission medications   Medication Sig Start Date End Date Taking? Authorizing Provider  cyclobenzaprine (FLEXERIL) 10 MG tablet Take 1 tablet (10 mg total) by mouth every 12 (twelve) hours as needed for muscle spasms. Patient not taking: Reported on 10/08/2020 02/15/18   Robinson, [place] N, PA-C  hydrocortisone (ANUSOL-HC) 2.5 % rectal cream Place 1 application rectally 2 (two) times daily. 10/08/20   Jacalyn Lefevre, MD    Allergies    Patient has no known allergies.  Review of Systems   Review of Systems  Gastrointestinal: Positive for blood in stool and rectal pain.  All other systems reviewed and are negative.   Physical Exam Updated Vital Signs BP (!) 134/94 (BP Location: Left Arm)   Pulse 87   Temp 97.8 F (36.6 C) (Oral)   Resp 16   SpO2 100%   Physical Exam Vitals and nursing note reviewed. Exam conducted with a chaperone present.  Constitutional:      Appearance: Normal appearance.  HENT:     Head: Normocephalic and atraumatic.     Right  Ear: External ear normal.     Left Ear: External ear normal.     Nose: Nose normal.     Mouth/Throat:     Mouth: Mucous membranes are moist.     Pharynx: Oropharynx is clear.  Eyes:     Extraocular Movements: Extraocular movements intact.     Conjunctiva/sclera: Conjunctivae normal.     Pupils: Pupils are equal, round, and reactive to light.  Cardiovascular:     Rate and Rhythm: Normal rate and regular rhythm.     Pulses: Normal pulses.     Heart sounds: Normal heart sounds.  Pulmonary:     Effort: Pulmonary effort is normal.     Breath sounds: Normal breath sounds.  Abdominal:     General: Abdomen is flat. Bowel sounds are normal.     Palpations: Abdomen is soft.  Genitourinary:    Rectum: Guaiac result positive. No tenderness or external hemorrhoid.  Musculoskeletal:        General: Normal range of motion.     Cervical back: Normal range of motion and neck supple.  Skin:    General: Skin is warm.     Capillary Refill: Capillary refill takes less than 2 seconds.  Neurological:     General: No focal deficit  present.     Mental Status: He is alert and oriented to person, place, and time.  Psychiatric:        Mood and Affect: Mood normal.        Behavior: Behavior normal.     ED Results / Procedures / Treatments   Labs (all labs ordered are listed, but only abnormal results are displayed) Labs Reviewed  BASIC METABOLIC PANEL - Abnormal; Notable for the following components:      Result Value   Glucose, Bld 101 (*)    All other components within normal limits  POC OCCULT BLOOD, ED - Abnormal; Notable for the following components:   Fecal Occult Bld POSITIVE (*)    All other components within normal limits  CBC WITH DIFFERENTIAL/PLATELET    EKG None  Radiology No results found.  Procedures Procedures (including critical care time)  Medications Ordered in ED Medications - No data to display  ED Course  I have reviewed the triage vital signs and the nursing  notes.  Pertinent labs & imaging results that were available during my care of the patient were reviewed by me and considered in my medical decision making (see chart for details).    MDM Rules/Calculators/A&P                          Stool is guaiac negative.  CBC is nl.  Vitals are nl.  Pt likely has an internal hemorrhoid.  He is told to f/u with GI.  Return if sx worsen.  Final Clinical Impression(s) / ED Diagnoses Final diagnoses:  Rectal bleeding    Rx / DC Orders ED Discharge Orders         Ordered    hydrocortisone (ANUSOL-HC) 2.5 % rectal cream  2 times daily        10/08/20 2241           Jacalyn Lefevre, MD 10/08/20 2242

## 2024-05-20 ENCOUNTER — Emergency Department (HOSPITAL_COMMUNITY)
Admission: EM | Admit: 2024-05-20 | Discharge: 2024-05-20 | Discharge status: Against Medical Advice | Payer: Self-pay | Attending: Emergency Medicine | Admitting: Emergency Medicine

## 2024-05-20 ENCOUNTER — Emergency Department (HOSPITAL_COMMUNITY): Payer: Self-pay

## 2024-05-20 ENCOUNTER — Encounter (HOSPITAL_COMMUNITY): Payer: Self-pay

## 2024-05-20 DIAGNOSIS — Z5321 Procedure and treatment not carried out due to patient leaving prior to being seen by health care provider: Secondary | ICD-10-CM | POA: Insufficient documentation

## 2024-05-20 DIAGNOSIS — M25561 Pain in right knee: Secondary | ICD-10-CM | POA: Insufficient documentation

## 2024-05-20 NOTE — ED Notes (Signed)
PT LEFT AMA 

## 2024-05-20 NOTE — ED Triage Notes (Signed)
 Patient reports pain in the right knee that started thi afternoon. States he cannot put any pressure on the knee. Also states he has chronic pain in his right achilles. Denies injury to the knee. Has not taken any medication for the pain.

## 2024-12-03 ENCOUNTER — Emergency Department (HOSPITAL_COMMUNITY)
Admission: EM | Admit: 2024-12-03 | Discharge: 2024-12-03 | Discharge status: Against Medical Advice | Payer: Self-pay | Attending: Emergency Medicine | Admitting: Emergency Medicine

## 2024-12-03 ENCOUNTER — Other Ambulatory Visit: Payer: Self-pay

## 2024-12-03 ENCOUNTER — Encounter (HOSPITAL_COMMUNITY): Payer: Self-pay | Admitting: *Deleted

## 2024-12-03 DIAGNOSIS — K0889 Other specified disorders of teeth and supporting structures: Secondary | ICD-10-CM | POA: Insufficient documentation

## 2024-12-03 DIAGNOSIS — Z5321 Procedure and treatment not carried out due to patient leaving prior to being seen by health care provider: Secondary | ICD-10-CM | POA: Insufficient documentation

## 2024-12-03 NOTE — ED Notes (Signed)
Pt called for room no response.  

## 2024-12-03 NOTE — ED Triage Notes (Signed)
 Dental pain for 3 days  no temp  he has no dentist

## 2024-12-03 NOTE — ED Triage Notes (Signed)
 The pt has not taken anything for pain

## 2024-12-25 ENCOUNTER — Encounter: Payer: Self-pay | Admitting: Family Medicine

## 2024-12-25 ENCOUNTER — Ambulatory Visit: Payer: Self-pay | Admitting: Family Medicine

## 2024-12-25 ENCOUNTER — Ambulatory Visit
Admission: RE | Admit: 2024-12-25 | Discharge: 2024-12-25 | Disposition: A | Source: Ambulatory Visit | Attending: Family Medicine

## 2024-12-25 VITALS — BP 130/80 | HR 62 | Ht 67.0 in | Wt 192.4 lb

## 2024-12-25 DIAGNOSIS — Z136 Encounter for screening for cardiovascular disorders: Secondary | ICD-10-CM | POA: Diagnosis not present

## 2024-12-25 DIAGNOSIS — R42 Dizziness and giddiness: Secondary | ICD-10-CM | POA: Diagnosis not present

## 2024-12-25 DIAGNOSIS — G8929 Other chronic pain: Secondary | ICD-10-CM | POA: Diagnosis not present

## 2024-12-25 DIAGNOSIS — H538 Other visual disturbances: Secondary | ICD-10-CM | POA: Diagnosis not present

## 2024-12-25 DIAGNOSIS — M25571 Pain in right ankle and joints of right foot: Secondary | ICD-10-CM | POA: Diagnosis not present

## 2024-12-25 DIAGNOSIS — E669 Obesity, unspecified: Secondary | ICD-10-CM | POA: Diagnosis not present

## 2024-12-25 LAB — POCT GLYCOSYLATED HEMOGLOBIN (HGB A1C): Hemoglobin A1C: 5.6 % (ref 4.0–5.6)

## 2024-12-25 MED ORDER — MECLIZINE HCL 12.5 MG PO TABS: 25.0000 mg | ORAL_TABLET | Freq: Three times a day (TID) | ORAL | 0 refills | Status: AC | PRN

## 2024-12-25 MED ORDER — MELOXICAM 15 MG PO TABS: 15.0000 mg | ORAL_TABLET | Freq: Every day | ORAL | 0 refills | Status: AC

## 2024-12-25 NOTE — Patient Instructions (Addendum)
 Please go get your xrays done at [facility]. You do not need to make an appointment. You can just show up.   Address: [address] [place], [place], [place] [address]   VISIT SUMMARY:  During your visit, we discussed your ankle swelling and dizziness while driving. We also addressed your muscle twitching and a recent unsettling experience you had. We have developed a plan to manage these issues and improve your overall health.  YOUR PLAN:  -CHRONIC RIGHT ANKLE PAIN: Your ankle pain is likely due to a previous injury, which has caused instability and inflammation, especially when you stand for long periods. We have ordered x-rays of your right ankle and prescribed a strong anti-inflammatory medication for 10 days. You should wear supportive shoes with a firm block and use a compression sleeve without straps during work. If your symptoms persist after this initial treatment, we may consider an MRI.  -VERTIGO WITH SITUATIONAL ANXIETY: Vertigo is a sensation of spinning, and in your case, it occurs when driving over 40 mph and is associated with anxiety. We have prescribed meclizine  to take before driving and advised you to monitor your response to this medication. Be aware of your anxiety symptoms and practice self-monitoring.  -DEHYDRATION: Dehydration occurs when your body does not have enough fluids and electrolytes. This can cause symptoms like muscle twitching. You should drink at least 60 ounces of fluid daily and include an electrolyte drink such as Powerade or Gatorade in your daily intake.  INSTRUCTIONS:  Please follow up with us  if your symptoms do not improve or if you have any concerns. We will review the results of your ankle x-rays and adjust your treatment plan as needed. If your vertigo or anxiety symptoms persist, let us  know so we can explore further options. Continue to monitor your fluid intake and ensure you are staying hydrated.
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
## 2024-12-26 ENCOUNTER — Ambulatory Visit: Payer: Self-pay | Admitting: Family Medicine

## 2024-12-26 DIAGNOSIS — E782 Mixed hyperlipidemia: Secondary | ICD-10-CM

## 2024-12-26 LAB — LIPID PANEL
Chol/HDL Ratio: 7.3 ratio — ABNORMAL HIGH (ref 0.0–5.0)
Cholesterol, Total: 257 mg/dL — ABNORMAL HIGH (ref 100–199)
HDL: 35 mg/dL — ABNORMAL LOW
LDL Chol Calc (NIH): 191 mg/dL — ABNORMAL HIGH (ref 0–99)
Triglycerides: 167 mg/dL — ABNORMAL HIGH (ref 0–149)
VLDL Cholesterol Cal: 31 mg/dL (ref 5–40)

## 2024-12-26 MED ORDER — ATORVASTATIN CALCIUM 40 MG PO TABS: 40.0000 mg | ORAL_TABLET | Freq: Every day | ORAL | 3 refills | Status: AC

## 2025-01-15 ENCOUNTER — Telehealth: Admitting: Family Medicine

## 2025-01-15 VITALS — Ht 67.0 in | Wt 192.0 lb

## 2025-01-15 DIAGNOSIS — B360 Pityriasis versicolor: Secondary | ICD-10-CM | POA: Diagnosis not present

## 2025-01-15 NOTE — Progress Notes (Signed)
" ° °  Subjective:    Patient ID: Gabriel Cervantes, male    DOB: [birth year], 44 y.o.   MRN: [id number]  HPI Documentation for virtual audio and video telecommunications through [place] encounter:  The patient was located at home. 2 patient identifiers used.  The provider was located in the office. The patient did consent to this visit and is aware of possible charges through their insurance for this visit.  The other persons participating in this telemedicine service were none. Time spent on call was 5 minutes and in review of previous records >15 minutes total for counseling and coordination of care.  This virtual service is not related to other E/M service within previous 7 days.  He is here for consultation concerning difficulty with a rash on his back.  Apparently he has been seen in the past and given a lotion to place on it but he is not sure what it is.  He stated it did help.  It does show some pigment changes and is slightly pruritic.  Review of Systems     Objective:    Physical Exam Visual exam the is cell phone, did show central areas of pigmentation with a more confluent area on the upper mid thoracic area between his shoulder blades.       Assessment & Plan:  Tinea versicolor Clinically it looks like tinea versicolor.  I will treat him with Lamisil AF daily for the next month.  He can also occasionally use Selsun Blue on his back but leave it on for about 5 minutes several times per week.  He is to take a picture of it today and again in a month and if still having difficulty then come in for more accurate visual inspection of the. "

## 2025-02-06 ENCOUNTER — Ambulatory Visit: Admitting: Family Medicine

## 2025-02-26 ENCOUNTER — Ambulatory Visit: Admitting: Family Medicine
